# Patient Record
Sex: Female | Born: 1995 | Race: Black or African American | Hispanic: No | Marital: Single | Smoking: Never smoker
Health system: Southern US, Community
[De-identification: ages and names within clinical notes are randomized; demographics above are authoritative.]

---

## 2014-01-05 ENCOUNTER — Emergency Department (HOSPITAL_COMMUNITY)
Admission: EM | Admit: 2014-01-05 | Discharge: 2014-01-05 | Disposition: A | Discharge status: Home / Self Care | Payer: Self-pay | Attending: Emergency Medicine | Admitting: Emergency Medicine

## 2014-01-05 ENCOUNTER — Encounter (HOSPITAL_COMMUNITY): Payer: Self-pay | Admitting: Emergency Medicine

## 2014-01-05 DIAGNOSIS — J029 Acute pharyngitis, unspecified: Secondary | ICD-10-CM | POA: Insufficient documentation

## 2014-01-05 LAB — RAPID STREP SCREEN (MED CTR MEBANE ONLY): Streptococcus, Group A Screen (Direct): NEGATIVE

## 2014-01-05 MED ORDER — IBUPROFEN 600 MG PO TABS: 600.0000 mg | ORAL_TABLET | Freq: Three times a day (TID) | ORAL | Status: DC

## 2014-01-05 NOTE — ED Provider Notes (Signed)
CSN: [id number]     Arrival date & time 01/05/14  [id number] History   First MD Initiated Contact with Patient 01/05/14 0601     Chief Complaint  Patient presents with  . Sore Throat     (Consider location/radiation/quality/duration/timing/severity/associated sxs/prior Treatment) Patient is a 18 y.o. female presenting with pharyngitis.  Sore Throat Associated symptoms include a sore throat. Pertinent negatives include no abdominal pain, chest pain, chills, congestion, coughing, diaphoresis, fever or headaches.    Ms Kiara Powell is a 18 year old woman with no PMH presenting with 2 days of sore throat. The sore throat came on suddenly two days ago and has been persistent since. She thinks she had a fever of 100-101 that day but since has been afebrile. She also thinks she had a 10 minute dry coughing spell that morning but no cough since. She went to her school's clinic on Sunday and was told to take took ibuprofen and mucinex for the throat but did not get swabbed. Nothing makes it better or worse.   History reviewed. No pertinent past medical history. History reviewed. No pertinent past surgical history. Family History  Problem Relation Age of Onset  . CAD Mother    History  Substance Use Topics  . Smoking status: Never Smoker   . Smokeless tobacco: Not on file  . Alcohol Use: Yes     Comment: rare   OB History   Grav Para Term Preterm Abortions TAB SAB Ect Mult Living                 Review of Systems  Constitutional: Negative for fever, chills and diaphoresis.  HENT: Positive for sore throat and trouble swallowing. Negative for congestion, ear discharge, ear pain and rhinorrhea.   Eyes: Negative for pain and discharge.  Respiratory: Negative for cough and shortness of breath.   Cardiovascular: Negative for chest pain and palpitations.  Gastrointestinal: Negative for abdominal pain.  Neurological: Negative for headaches.  Hematological: Negative for adenopathy.      Allergies   Review of patient's allergies indicates no known allergies.  Home Medications   Prior to Admission medications   Medication Sig Start Date End Date Taking? Authorizing Provider  ibuprofen (ADVIL,MOTRIN) 200 MG tablet Take 600 mg by mouth every 6 (six) hours as needed for moderate pain.   Yes Historical Provider, MD  Phenylephrine-APAP-Guaifenesin [facility](MUCINEX SINUS-MAX) 10-650-400 MG/20ML LIQD Take 20 mLs by mouth every 8 (eight) hours as needed. Congestion   Yes Historical Provider, MD   BP 128/70  Pulse 97  Temp(Src) 98.4 F (36.9 C) (Oral)  Resp 18  Ht 5\' 6"  (1.676 m)  Wt 119 lb (53.978 kg)  BMI 19.22 kg/m2  SpO2 100%  LMP 12/25/2013 Physical Exam  Vitals reviewed. Constitutional: She is oriented to person, place, and time. She appears well-developed and well-nourished. No distress.  HENT:  Head: Normocephalic and atraumatic.  Right Ear: External ear normal.  Left Ear: External ear normal.  Mouth/Throat: Oropharynx is clear and moist. No oropharyngeal exudate.  Eyes: Conjunctivae and EOM are normal. Pupils are equal, round, and reactive to light. Right eye exhibits no discharge. Left eye exhibits no discharge. No scleral icterus.  Neck: Normal range of motion.  Cardiovascular: Normal rate, regular rhythm, normal heart sounds and intact distal pulses.  Exam reveals no gallop and no friction rub.   No murmur heard. Pulmonary/Chest: Effort normal and breath sounds normal. No respiratory distress.  Abdominal: Soft. Bowel sounds are normal. She exhibits no distension. There  is no tenderness.  Lymphadenopathy:    She has no cervical adenopathy.  Neurological: She is alert and oriented to person, place, and time.  Skin: She is not diaphoretic.    ED Course  Procedures (including critical care time) Labs Review Labs Reviewed  RAPID STREP SCREEN  CULTURE, GROUP A STREP    Imaging Review No results found.   EKG Interpretation None      MDM   Final diagnoses:  None     7:24AM: Patient with no PMH has sore throat x 2 days. Rapid strep test negative. No fever (question of initial fever at home), no exudate, no cervical lymphadenopathy likely viral pharyngitis. Will encourage fluid hydration and provide anti-inflammatory.    Lorenda Hatchet, MD 01/05/14 0730

## 2014-01-05 NOTE — Discharge Instructions (Signed)
You were seen in the ED today for your sore throat which is likely due to a viral infection. Please drink plenty of fluids and take the ibuprofen as prescribed. Please seek medical attention or return to the ED if you have any new or worsening throat pain, difficulty swallowing, fevers, or other worrisome medical condition.

## 2014-01-05 NOTE — ED Notes (Signed)
Pt is c/o a sore throat since Sunday  Pt states it hurts to swallow and even to breathe  Pt has been taking OTC medication without relief

## 2014-01-05 NOTE — ED Provider Notes (Signed)
I saw and evaluated the patient, reviewed the resident's note and I agree with the findings and plan.   EKG Interpretation None      Likely viral. No PTA. Well appearing  Lyanne CoKevin M Quy Lotts, MD 01/05/14 [phone]

## 2014-01-07 LAB — CULTURE, GROUP A STREP

## 2015-05-02 ENCOUNTER — Emergency Department (HOSPITAL_COMMUNITY)
Admission: EM | Admit: 2015-05-02 | Discharge: 2015-05-02 | Disposition: A | Discharge status: Home / Self Care | Payer: BLUE CROSS/BLUE SHIELD | Attending: Emergency Medicine | Admitting: Emergency Medicine

## 2015-05-02 ENCOUNTER — Encounter (HOSPITAL_COMMUNITY): Payer: Self-pay | Admitting: Emergency Medicine

## 2015-05-02 DIAGNOSIS — H9209 Otalgia, unspecified ear: Secondary | ICD-10-CM | POA: Diagnosis not present

## 2015-05-02 DIAGNOSIS — R05 Cough: Secondary | ICD-10-CM | POA: Diagnosis present

## 2015-05-02 DIAGNOSIS — J069 Acute upper respiratory infection, unspecified: Secondary | ICD-10-CM | POA: Diagnosis not present

## 2015-05-02 NOTE — ED Provider Notes (Signed)
CSN: [id number]     Arrival date & time 05/02/15  1913 History  By signing my name below, I, Budd Palmer, attest that this documentation has been prepared under the direction and in the presence of [employer], PA-C. Electronically Signed: Budd Palmer, ED Scribe. 05/02/2015. 8:06 PM.    Chief Complaint  Patient presents with  . URI   The history is provided by the patient. No language interpreter was used.   HPI Comments: Kiara Powell is a 19 y.o. female who presents to the Emergency Department complaining of a worsening URI onset 5 days ago, worsening significantly as of yesterday. She reports associated congestion, HA, non-productive cough, rhinorrhea, ear pain, sore throat, and post-nasal drip. She notes that she had some bloody mucus after blowing her nose today, but reports she has been blowing her nose quite often since onset. She states she has tried taking Dayquil and Nyquil with no relief. She states she did not receive a flu shot this year. Pt denies fever and n/v.   History reviewed. No pertinent past medical history. History reviewed. No pertinent past surgical history. Family History  Problem Relation Age of Onset  . CAD Mother    Social History  Substance Use Topics  . Smoking status: Never Smoker   . Smokeless tobacco: None  . Alcohol Use: No   OB History    No data available     Review of Systems  Constitutional: Negative for fever.  HENT: Positive for congestion, ear pain, postnasal drip, rhinorrhea, sinus pressure and sore throat.   Respiratory: Positive for cough.   Gastrointestinal: Negative for nausea and vomiting.  Neurological: Positive for headaches.  All other systems reviewed and are negative.   Allergies  Review of patient's allergies indicates no known allergies.  Home Medications   Prior to Admission medications   Medication Sig Start Date End Date Taking? Authorizing Provider  ibuprofen (ADVIL,MOTRIN) 200 MG tablet Take 600 mg by  mouth every 6 (six) hours as needed for moderate pain.    Historical Provider, MD  ibuprofen (ADVIL,MOTRIN) 600 MG tablet Take 1 tablet (600 mg total) by mouth 3 (three) times daily. For five days. Take with food. 01/05/14   Lorenda Hatchet, MD  Phenylephrine-APAP-Guaifenesin [facility]) [phone] MG/20ML LIQD Take 20 mLs by mouth every 8 (eight) hours as needed. Congestion    Historical Provider, MD   BP 113/66 mmHg  Pulse 85  Temp(Src) 98.2 F (36.8 C) (Oral)  Resp 18  Wt 115 lb (52.164 kg)  SpO2 100%  LMP 04/23/2015 (Exact Date) Physical Exam  Constitutional: She is oriented to person, place, and time. She appears well-developed and well-nourished. No distress.  HENT:  Head: Normocephalic and atraumatic.  Right Ear: Tympanic membrane and external ear normal.  Left Ear: Tympanic membrane and external ear normal.  Nose: Nose normal.  Mouth/Throat: Oropharynx is clear and moist. No oropharyngeal exudate.  Eyes: Conjunctivae and EOM are normal. Pupils are equal, round, and reactive to light. Right eye exhibits no discharge. Left eye exhibits no discharge. No scleral icterus.  Neck: Neck supple.  Cardiovascular: Normal rate, regular rhythm, normal heart sounds and intact distal pulses.  Exam reveals no gallop and no friction rub.   No murmur heard. Pulmonary/Chest: Effort normal and breath sounds normal. No respiratory distress. She has no wheezes. She has no rales. She exhibits no tenderness.  Abdominal: Soft. She exhibits no distension. There is no tenderness. There is no guarding.  Musculoskeletal: Normal range of motion.  She exhibits no edema.  Lymphadenopathy:    She has no cervical adenopathy.  Neurological: She is alert and oriented to person, place, and time. Coordination normal.  Skin: Skin is warm and dry. No rash noted. She is not diaphoretic. No erythema. No pallor.  Psychiatric: She has a normal mood and affect. Her behavior is normal.  Nursing note and vitals  reviewed.   ED Course  Procedures  DIAGNOSTIC STUDIES: Oxygen Saturation is 100% on RA, normal by my interpretation.    COORDINATION OF CARE: 8:06 PM - Discussed probable viral infection and sinus congestion. Discussed plans to discharge. Advised to take sudafed for decongestant and to stay hydrated. Pt advised of plan for treatment and pt agrees.  Labs Review Labs Reviewed - No data to display  Imaging Review No results found. I have personally reviewed and evaluated these images and lab results as part of my medical decision-making.   EKG Interpretation None      MDM   Final diagnoses:  URI (upper respiratory infection)   Patients symptoms are consistent with URI, likely viral etiology. Discussed that antibiotics are not indicated for viral infections. Lungs CTAB. No SOB. No cxr indicated at this time. Pt will be discharged with symptomatic treatment.  Verbalizes understanding and is agreeable with plan. Pt is hemodynamically stable & in NAD prior to dc.   I personally performed the services described in this documentation, which was scribed in my presence. The recorded information has been reviewed and is accurate.     Lester KinsmanSamantha Tripp RoxanaDowless, PA-C 05/03/15 2322  Lorre NickAnthony Allen, MD 05/05/15 [phone]

## 2015-05-02 NOTE — Discharge Instructions (Signed)
Upper Respiratory Infection, Adult Most upper respiratory infections (URIs) are a viral infection of the air passages leading to the lungs. A URI affects the nose, throat, and upper air passages. The most common type of URI is nasopharyngitis and is typically referred to as "the common cold." URIs run their course and usually go away on their own. Most of the time, a URI does not require medical attention, but sometimes a bacterial infection in the upper airways can follow a viral infection. This is called a secondary infection. Sinus and middle ear infections are common types of secondary upper respiratory infections. Bacterial pneumonia can also complicate a URI. A URI can worsen asthma and chronic obstructive pulmonary disease (COPD). Sometimes, these complications can require emergency medical care and may be life threatening.  CAUSES Almost all URIs are caused by viruses. A virus is a type of germ and can spread from one person to another.  RISKS FACTORS You may be at risk for a URI if:   You smoke.   You have chronic heart or lung disease.  You have a weakened defense (immune) system.   You are very young or very old.   You have nasal allergies or asthma.  You work in crowded or poorly ventilated areas.  You work in health care facilities or schools. SIGNS AND SYMPTOMS  Symptoms typically develop 2-3 days after you come in contact with a cold virus. Most viral URIs last 7-10 days. However, viral URIs from the influenza virus (flu virus) can last 14-18 days and are typically more severe. Symptoms may include:   Runny or stuffy (congested) nose.   Sneezing.   Cough.   Sore throat.   Headache.   Fatigue.   Fever.   Loss of appetite.   Pain in your forehead, behind your eyes, and over your cheekbones (sinus pain).  Muscle aches.  DIAGNOSIS  Your health care provider may diagnose a URI by:  Physical exam.  Tests to check that your symptoms are not due to  another condition such as:  Strep throat.  Sinusitis.  Pneumonia.  Asthma. TREATMENT  A URI goes away on its own with time. It cannot be cured with medicines, but medicines may be prescribed or recommended to relieve symptoms. Medicines may help:  Reduce your fever.  Reduce your cough.  Relieve nasal congestion. HOME CARE INSTRUCTIONS   Take medicines only as directed by your health care provider.   Gargle warm saltwater or take cough drops to comfort your throat as directed by your health care provider.  Use a warm mist humidifier or inhale steam from a shower to increase air moisture. This may make it easier to breathe.  Drink enough fluid to keep your urine clear or pale yellow.   Eat soups and other clear broths and maintain good nutrition.   Rest as needed.   Return to work when your temperature has returned to normal or as your health care provider advises. You may need to stay home longer to avoid infecting others. You can also use a face mask and careful hand washing to prevent spread of the virus.  Increase the usage of your inhaler if you have asthma.   Do not use any tobacco products, including cigarettes, chewing tobacco, or electronic cigarettes. If you need help quitting, ask your health care provider. PREVENTION  The best way to protect yourself from getting a cold is to practice good hygiene.   Avoid oral or hand contact with people with cold  symptoms.   Wash your hands often if contact occurs.  There is no clear evidence that vitamin C, vitamin E, echinacea, or exercise reduces the chance of developing a cold. However, it is always recommended to get plenty of rest, exercise, and practice good nutrition.  SEEK MEDICAL CARE IF:   You are getting worse rather than better.   Your symptoms are not controlled by medicine.   You have chills.  You have worsening shortness of breath.  You have brown or red mucus.  You have yellow or brown nasal  discharge.  You have pain in your face, especially when you bend forward.  You have a fever.  You have swollen neck glands.  You have pain while swallowing.  You have white areas in the back of your throat. SEEK IMMEDIATE MEDICAL CARE IF:   You have severe or persistent:  Headache.  Ear pain.  Sinus pain.  Chest pain.  You have chronic lung disease and any of the following:  Wheezing.  Prolonged cough.  Coughing up blood.  A change in your usual mucus.  You have a stiff neck.  You have changes in your:  Vision.  Hearing.  Thinking.  Mood. MAKE SURE YOU:   Understand these instructions.  Will watch your condition.  Will get help right away if you are not doing well or get worse.   This information is not intended to replace advice given to you by your health care provider. Make sure you discuss any questions you have with your health care provider.  Follow-up with her primary care provider if symptoms aren't improved. Take OTC Sudafed as needed for decongestion., Return to the emergency department if you experience fever, difficulty breathing, difficulty swallowing, vomiting.

## 2015-05-02 NOTE — ED Notes (Signed)
Pt states she has been having cold symptoms x 1 week  Pt states she has been taking OTC medication but it is not helping  Pt states her sxs got worse yesterday

## 2015-06-21 ENCOUNTER — Emergency Department (HOSPITAL_COMMUNITY): Payer: BLUE CROSS/BLUE SHIELD

## 2015-06-21 ENCOUNTER — Emergency Department (HOSPITAL_COMMUNITY)
Admission: EM | Admit: 2015-06-21 | Discharge: 2015-06-22 | Disposition: A | Discharge status: Home / Self Care | Payer: BLUE CROSS/BLUE SHIELD | Attending: Emergency Medicine | Admitting: Emergency Medicine

## 2015-06-21 ENCOUNTER — Encounter (HOSPITAL_COMMUNITY): Payer: Self-pay | Admitting: Emergency Medicine

## 2015-06-21 DIAGNOSIS — R0789 Other chest pain: Secondary | ICD-10-CM

## 2015-06-21 DIAGNOSIS — J45909 Unspecified asthma, uncomplicated: Secondary | ICD-10-CM | POA: Diagnosis not present

## 2015-06-21 DIAGNOSIS — R079 Chest pain, unspecified: Secondary | ICD-10-CM | POA: Diagnosis present

## 2015-06-21 LAB — I-STAT TROPONIN, ED: Troponin i, poc: 0.01 ng/mL (ref 0.00–0.08)

## 2015-06-21 LAB — BASIC METABOLIC PANEL
Anion gap: 10 (ref 5–15)
BUN: 17 mg/dL (ref 6–20)
CHLORIDE: 108 mmol/L (ref 101–111)
CO2: 23 mmol/L (ref 22–32)
Calcium: 9.2 mg/dL (ref 8.9–10.3)
Creatinine, Ser: 1.01 mg/dL — ABNORMAL HIGH (ref 0.44–1.00)
GFR calc Af Amer: >60 mL/min (ref >60)
GFR calc non Af Amer: >60 mL/min (ref >60)
Glucose, Bld: 81 mg/dL (ref 65–99)
Potassium: 4.2 mmol/L (ref 3.5–5.1)
Sodium: 141 mmol/L (ref 135–145)

## 2015-06-21 LAB — CBC
HCT: 33.7 % — ABNORMAL LOW (ref 36.0–46.0)
Hemoglobin: 10.9 g/dL — ABNORMAL LOW (ref 12.0–15.0)
MCH: 26.6 pg (ref 26.0–34.0)
MCHC: 32.3 g/dL (ref 30.0–36.0)
MCV: 82.2 fL (ref 78.0–100.0)
PLATELETS: 248 10*3/uL (ref 150–400)
RBC: 4.1 MIL/uL (ref 3.87–5.11)
RDW: 13.7 % (ref 11.5–15.5)
WBC: 10.1 10*3/uL (ref 4.0–10.5)

## 2015-06-21 NOTE — ED Notes (Signed)
Patient presents for centralized chest pain, non radiating starting earlier this afternoon. States she was running and became SOB and then had a sudden onset of chest soreness. Denies cough, N/V, diaphoresis.

## 2015-06-21 NOTE — ED Notes (Signed)
Delay in lab draw, pt in exray 

## 2015-06-21 NOTE — ED Provider Notes (Signed)
CSN: [id number]     Arrival date & time 06/21/15  2209 History   First MD Initiated Contact with Patient 06/21/15 2231     Chief Complaint  Patient presents with  . Chest Pain     HPI   Kiara Powell is an 20 y.o. female with history of exercise-induced asthma who presents to the ED for evaluation of chest pain. She states she was in her usual state of health until 8AM this morning when she was playing basketball in her school gym and started feeling short of breath. She states it felt like an asthma attack, which she has not had in years. She states that as she caught her breath she began to experience substernal sharp chest pain. She states the pain waxed and waned all day. States it eventually resolved over the course of the afternoon. However, she states she continues to feel pressure in her chest. She states the sensation does not radiate. She states it feels like it is worse when she swallows. Denies feeling faint, dizzy, or lightheaded. Denies diaphoresis. Denies syncope. Denies anything like this ever happening before. Denies fam hx of sudden cardiac death. She has not tried anything to help with her symptoms.   History reviewed. No pertinent past medical history. History reviewed. No pertinent past surgical history. Family History  Problem Relation Age of Onset  . CAD Mother    Social History  Substance Use Topics  . Smoking status: Never Smoker   . Smokeless tobacco: None  . Alcohol Use: No   OB History    No data available     Review of Systems  All other systems reviewed and are negative.     Allergies  Review of patient's allergies indicates no known allergies.  Home Medications   Prior to Admission medications   Not on File   BP 117/71 mmHg  Pulse 67  Temp(Src) 98 F (36.7 C) (Oral)  Resp 18  SpO2 99%  LMP 06/21/2015 Physical Exam  Constitutional: She is oriented to person, place, and time. No distress.  HENT:  Right Ear: External ear normal.  Left Ear:  External ear normal.  Nose: Nose normal.  Mouth/Throat: Oropharynx is clear and moist. No oropharyngeal exudate.  Eyes: Conjunctivae and EOM are normal. Pupils are equal, round, and reactive to light.  Neck: Normal range of motion. Neck supple.  Cardiovascular: Normal rate, regular rhythm, normal heart sounds and intact distal pulses.   Pulmonary/Chest: Effort normal and breath sounds normal. No respiratory distress. She has no wheezes. She has no rales.    Chest pain/pressure reproducible with mid-sternal palpation  Abdominal: Soft. Bowel sounds are normal. She exhibits no distension. There is no tenderness.  Musculoskeletal: She exhibits no edema.  Neurological: She is alert and oriented to person, place, and time. No cranial nerve deficit.  Skin: Skin is warm and dry. She is not diaphoretic.  Psychiatric: She has a normal mood and affect.  Nursing note and vitals reviewed.   ED Course  Procedures (including critical care time) Labs Review Labs Reviewed  BASIC METABOLIC PANEL - Abnormal; Notable for the following:    Creatinine, Ser 1.01 (*)    All other components within normal limits  CBC - Abnormal; Notable for the following:    Hemoglobin 10.9 (*)    HCT 33.7 (*)    All other components within normal limits  I-STAT TROPOININ, ED  Rosezena Sensor, ED    Imaging Review Dg Chest 2 View  06/21/2015  CLINICAL DATA:  Centralized non radiating chest pain beginning this afternoon while running. Shortness of breath. EXAM: CHEST  2 VIEW COMPARISON:  None. FINDINGS: Cardiomediastinal silhouette is normal. The lungs are clear without pleural effusions or focal consolidations. Trachea projects midline and there is no pneumothorax. Soft tissue planes and included osseous structures are non-suspicious. IMPRESSION: Normal chest. Electronically Signed   By: Awilda Metro M.D.   On: 06/21/2015 23:20   I have personally reviewed and evaluated these images and lab results as part of my  medical decision-making.   EKG Interpretation   Date/Time:  Tuesday June 21 2015 22:21:27 EST Ventricular Rate:  73 PR Interval:  151 QRS Duration: 85 QT Interval:  372 QTC Calculation: 410 R Axis:   84 Text Interpretation:  Sinus rhythm RSR' in V1 or V2, probably normal  variant No previous ECGs available Confirmed by Manus Gunning  MD, STEPHEN  [phone]) on 06/21/2015 10:25:16 PM      MDM   Final diagnoses:  Chest wall pain    Pt is an 20 y.o. female with history of asthma with chest pain that is now chest pressure that started after what sounds like an episode of exercise-induced asthma, which she has a known history of. Initial troponin and EKG negative. CXR unremarkable. Pressure is reproducible with palpation. I suspect musculoskeletal chest wall pain. Delta trop is pending which I anticipate will be negative. Will have to sign out to oncoming PA-C Elpidio Anis. Anticipate discharge home with PCP f/u pending negative delta troponin.    Carlene Coria, PA-C 06/22/15 9629  Melene Plan, DO 06/22/15 2303

## 2015-06-22 LAB — I-STAT TROPONIN, ED: TROPONIN I, POC: 0 ng/mL (ref 0.00–0.08)

## 2015-06-22 MED ORDER — IBUPROFEN 600 MG PO TABS: 600.0000 mg | ORAL_TABLET | Freq: Four times a day (QID) | ORAL | Status: DC | PRN

## 2015-06-22 NOTE — Discharge Instructions (Signed)
Chest Wall Pain °Chest wall pain is pain in or around the bones and muscles of your chest. Sometimes, an injury causes this pain. Sometimes, the cause may not be known. This pain may take several weeks or longer to get better. °HOME CARE INSTRUCTIONS  °Pay attention to any changes in your symptoms. Take these actions to help with your pain:  °· Rest as told by your health care provider.   °· Avoid activities that cause pain. These include any activities that use your chest muscles or your abdominal and side muscles to lift heavy items.    °· If directed, apply ice to the painful area: °· Put ice in a plastic bag. °· Place a towel between your skin and the bag. °· Leave the ice on for 20 minutes, 2-3 times per day. °· Take over-the-counter and prescription medicines only as told by your health care provider. °· Do not use tobacco products, including cigarettes, chewing tobacco, and e-cigarettes. If you need help quitting, ask your health care provider. °· Keep all follow-up visits as told by your health care provider. This is important. °SEEK MEDICAL CARE IF: °· You have a fever. °· Your chest pain becomes worse. °· You have new symptoms. °SEEK IMMEDIATE MEDICAL CARE IF: °· You have nausea or vomiting. °· You feel sweaty or light-headed. °· You have a cough with phlegm (sputum) or you cough up blood. °· You develop shortness of breath. °  °This information is not intended to replace advice given to you by your health care provider. Make sure you discuss any questions you have with your health care provider. °  °Document Released: 05/07/2005 Document Revised: 01/26/2015 Document Reviewed: 08/02/2014 °Elsevier Interactive Patient Education ©2016 Elsevier Inc[employer] °Heat Therapy °Heat therapy can help ease sore, stiff, injured, and tight muscles and joints. Heat relaxes your muscles, which may help ease your pain.  °RISKS AND COMPLICATIONS °If you have any of the following conditions, do not use heat therapy unless your  health care provider has approved: °· Poor circulation. °· Healing wounds or scarred skin in the area being treated. °· Diabetes, heart disease, or high blood pressure. °· Not being able to feel (numbness) the area being treated. °· Unusual swelling of the area being treated. °· Active infections. °· Blood clots. °· Cancer. °· Inability to communicate pain. This may include young children and people who have problems with their brain function (dementia). °· Pregnancy. °Heat therapy should only be used on old, pre-existing, or long-lasting (chronic) injuries. Do not use heat therapy on new injuries unless directed by your health care provider. °HOW TO USE HEAT THERAPY °There are several different kinds of heat therapy, including: °· Moist heat pack. °· Warm water bath. °· Hot water bottle. °· Electric heating pad. °· Heated gel pack. °· Heated wrap. °· Electric heating pad. °Use the heat therapy method suggested by your health care provider. Follow your health care provider's instructions on when and how to use heat therapy. °GENERAL HEAT THERAPY RECOMMENDATIONS °· Do not sleep while using heat therapy. Only use heat therapy while you are awake. °· Your skin may turn pink while using heat therapy. Do not use heat therapy if your skin turns red. °· Do not use heat therapy if you have new pain. °· High heat or long exposure to heat can cause burns. Be careful when using heat therapy to avoid burning your skin. °· Do not use heat therapy on areas of your skin that are already irritated, such as with a   rash or sunburn. °SEEK MEDICAL CARE IF: °· You have blisters, redness, swelling, or numbness. °· You have new pain. °· Your pain is worse. °MAKE SURE YOU: °· Understand these instructions. °· Will watch your condition. °· Will get help right away if you are not doing well or get worse. °  °This information is not intended to replace advice given to you by your health care provider. Make sure you discuss any questions you  have with your health care provider. °  °Document Released: 07/30/2011 Document Revised: 05/28/2014 Document Reviewed: 06/30/2013 °Elsevier Interactive Patient Education ©2016 Elsevier Inc. ° °

## 2015-06-22 NOTE — ED Notes (Signed)
Discharge instructions, follow up care, and rx x1 reviewed with patient. Patient verbalized understanding. 

## 2015-06-22 NOTE — ED Notes (Signed)
PA at bedside.

## 2015-06-22 NOTE — ED Provider Notes (Signed)
SOB, chest tightness with persistent chest pressure (was pain) while playing basketball this morning. No fevers, no cough. Chest wall tenderness on exam.  Delta troponin pending.   Delta trop negative. Will treat as chest wall pain c/w evaluation and exam. VSS.   Elpidio Anis, PA-C 06/22/15 [id number]  Azalia Bilis, MD 06/22/15 [phone]

## 2015-07-09 ENCOUNTER — Encounter (HOSPITAL_COMMUNITY): Payer: Self-pay | Admitting: Emergency Medicine

## 2015-07-09 ENCOUNTER — Emergency Department (HOSPITAL_COMMUNITY)
Admission: EM | Admit: 2015-07-09 | Discharge: 2015-07-09 | Disposition: A | Discharge status: Home / Self Care | Payer: BLUE CROSS/BLUE SHIELD | Attending: Emergency Medicine | Admitting: Emergency Medicine

## 2015-07-09 DIAGNOSIS — F10120 Alcohol abuse with intoxication, uncomplicated: Secondary | ICD-10-CM | POA: Diagnosis not present

## 2015-07-09 DIAGNOSIS — F10929 Alcohol use, unspecified with intoxication, unspecified: Secondary | ICD-10-CM | POA: Insufficient documentation

## 2015-07-09 DIAGNOSIS — F419 Anxiety disorder, unspecified: Secondary | ICD-10-CM | POA: Insufficient documentation

## 2015-07-09 DIAGNOSIS — R064 Hyperventilation: Secondary | ICD-10-CM | POA: Insufficient documentation

## 2015-07-09 DIAGNOSIS — F10920 Alcohol use, unspecified with intoxication, uncomplicated: Secondary | ICD-10-CM

## 2015-07-09 DIAGNOSIS — Z3202 Encounter for pregnancy test, result negative: Secondary | ICD-10-CM | POA: Diagnosis not present

## 2015-07-09 DIAGNOSIS — F10129 Alcohol abuse with intoxication, unspecified: Secondary | ICD-10-CM | POA: Diagnosis present

## 2015-07-09 LAB — I-STAT CHEM 8, ED
BUN: 9 mg/dL (ref 6–20)
CALCIUM ION: 1.12 mmol/L (ref 1.12–1.23)
Chloride: 112 mmol/L — ABNORMAL HIGH (ref 101–111)
Creatinine, Ser: 0.8 mg/dL (ref 0.44–1.00)
GLUCOSE: 133 mg/dL — AB (ref 65–99)
HCT: 40 % (ref 36.0–46.0)
HEMOGLOBIN: 13.6 g/dL (ref 12.0–15.0)
Potassium: 3.5 mmol/L (ref 3.5–5.1)
Sodium: 145 mmol/L (ref 135–145)
TCO2: 15 mmol/L (ref 0–100)

## 2015-07-09 LAB — CBC WITH DIFFERENTIAL/PLATELET
BASOS ABS: 0 10*3/uL (ref 0.0–0.1)
BASOS PCT: 0 %
Eosinophils Absolute: 0.1 10*3/uL (ref 0.0–0.7)
Eosinophils Relative: 1 %
HCT: 36.3 % (ref 36.0–46.0)
Hemoglobin: 12.1 g/dL (ref 12.0–15.0)
LYMPHS ABS: 2.9 10*3/uL (ref 0.7–4.0)
Lymphocytes Relative: 33 %
MCH: 27.3 pg (ref 26.0–34.0)
MCHC: 33.3 g/dL (ref 30.0–36.0)
MCV: 81.9 fL (ref 78.0–100.0)
MONOS PCT: 7 %
Monocytes Absolute: 0.6 10*3/uL (ref 0.1–1.0)
NEUTROS ABS: 5.4 10*3/uL (ref 1.7–7.7)
NEUTROS PCT: 59 %
PLATELETS: 250 10*3/uL (ref 150–400)
RBC: 4.43 MIL/uL (ref 3.87–5.11)
RDW: 13.4 % (ref 11.5–15.5)
WBC: 8.9 10*3/uL (ref 4.0–10.5)

## 2015-07-09 LAB — COMPREHENSIVE METABOLIC PANEL
ALT: 16 U/L (ref 14–54)
ANION GAP: 11 (ref 5–15)
AST: 22 U/L (ref 15–41)
Albumin: 4.9 g/dL (ref 3.5–5.0)
Alkaline Phosphatase: 38 U/L (ref 38–126)
BUN: 10 mg/dL (ref 6–20)
CALCIUM: 9.3 mg/dL (ref 8.9–10.3)
CHLORIDE: 113 mmol/L — AB (ref 101–111)
CO2: 16 mmol/L — ABNORMAL LOW (ref 22–32)
Creatinine, Ser: 0.7 mg/dL (ref 0.44–1.00)
GFR calc non Af Amer: >60 mL/min (ref >60)
Glucose, Bld: 141 mg/dL — ABNORMAL HIGH (ref 65–99)
Potassium: 3.3 mmol/L — ABNORMAL LOW (ref 3.5–5.1)
Sodium: 140 mmol/L (ref 135–145)
Total Bilirubin: 0.5 mg/dL (ref 0.3–1.2)
Total Protein: 8.1 g/dL (ref 6.5–8.1)

## 2015-07-09 LAB — URINALYSIS, ROUTINE W REFLEX MICROSCOPIC
Bilirubin Urine: NEGATIVE
Glucose, UA: NEGATIVE mg/dL
Hgb urine dipstick: NEGATIVE
Ketones, ur: NEGATIVE mg/dL
LEUKOCYTES UA: NEGATIVE
Nitrite: NEGATIVE
PH: 6 (ref 5.0–8.0)
PROTEIN: NEGATIVE mg/dL
Specific Gravity, Urine: 1.006 (ref 1.005–1.030)

## 2015-07-09 LAB — ACETAMINOPHEN LEVEL

## 2015-07-09 LAB — I-STAT BETA HCG BLOOD, ED (MC, WL, AP ONLY)

## 2015-07-09 LAB — RAPID URINE DRUG SCREEN, HOSP PERFORMED
Amphetamines: NOT DETECTED
Barbiturates: NOT DETECTED
Benzodiazepines: NOT DETECTED
COCAINE: NOT DETECTED
Opiates: NOT DETECTED
Tetrahydrocannabinol: NOT DETECTED

## 2015-07-09 LAB — ETHANOL: Alcohol, Ethyl (B): 114 mg/dL — ABNORMAL HIGH (ref <5)

## 2015-07-09 LAB — SALICYLATE LEVEL: Salicylate Lvl: <4 mg/dL (ref 2.8–30.0)

## 2015-07-09 MED ORDER — SODIUM CHLORIDE 0.9 % IV BOLUS (SEPSIS)
1000.0000 mL | Freq: Once | INTRAVENOUS | Status: AC
  Administered 2015-07-09: 1000 mL via INTRAVENOUS

## 2015-07-09 MED ORDER — SODIUM CHLORIDE 0.9 % IV SOLN
INTRAVENOUS | Status: DC
Start: 2015-07-09 — End: 2015-07-09
  Administered 2015-07-09: 125 mL/h via INTRAVENOUS
  Administered 2015-07-09: 13:00:00 via INTRAVENOUS
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
## 2015-07-09 NOTE — ED Notes (Signed)
Patient given ginger ale. 
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
## 2015-07-09 NOTE — BH Assessment (Signed)
Per Dr. Jannifer Franklin and Julieanne Cotton, NP patient to discharge home. Patient will follow up with # on insurance card to locate a therapist (patient moving to [place]).

## 2015-07-09 NOTE — [facility] Suicide Risk Assessment (Cosign Needed)
Suicide Risk Assessment  Discharge Assessment   [facility] Discharge Suicide Risk Assessment   Principal Problem: Alcoholic intoxication without complication [facility]) Discharge Diagnoses:  Patient Active Problem List   Diagnosis Date Noted  . Alcoholic intoxication without complication (HCC) [F10.120] 07/09/2015    Priority: High  . Alcohol intoxication (HCC) [F10.129]   . Anxiety [F41.9]     Total Time spent with patient: 20 minutes  Musculoskeletal: Strength & Muscle Tone: within normal limits Gait & Station: normal Patient leans: N/A  Psychiatric Specialty Exam:   Blood pressure 127/86, pulse 71, temperature 98.6 F (37 C), temperature source Oral, resp. rate 16, last menstrual period 06/21/2015, SpO2 100 %.There is no weight on file to calculate BMI.  General Appearance: Casual and Fairly Groomed  Patent attorney:: Good  Speech: Clear and Coherent and Normal Rate  Volume: Normal  Mood: Angry and Anxious  Affect: Congruent and Tearful  Thought Process: Coherent, Goal Directed and Intact  Orientation: Full (Time, Place, and Person)  Thought Content: WDL  Suicidal Thoughts: No  Homicidal Thoughts: No  Memory: Immediate; Good Recent; Good Remote; Good  Judgement: Good  Insight: Good  Psychomotor Activity: Normal  Concentration: Good  Recall: NA  Fund of Knowledge:Good  Language: Good  Akathisia: NA  Handed: Right  AIMS (if indicated):    Assets: Desire for Improvement  ADL's: Intact  Cognition: WNL         Mental Status Per Nursing Assessment::   On Admission:     Demographic Factors:  Adolescent or young adult  Loss Factors: NA  Historical Factors: NA  Risk Reduction Factors:   Living with another person, especially a relative  Continued Clinical Symptoms:  Alcohol/Substance Abuse/Dependencies  Cognitive Features That Contribute To Risk:  None    Suicide Risk:  Minimal: No identifiable suicidal  ideation.  Patients presenting with no risk factors but with morbid ruminations; may be classified as minimal risk based on the severity of the depressive symptoms    Plan Of Care/Follow-up recommendations:  Activity:  as tolerated Diet:  Regular  Earney Navy, NP   PMHNP-BC 07/09/2015, 1:14 PM

## 2015-07-09 NOTE — BH Assessment (Signed)
Assessment completed. Consulted Hulan Fess, NP who recommended that pt be evaluated in the morning. Informed Danelle Berry, PA-C of the recommendation.

## 2015-07-09 NOTE — ED Notes (Signed)
Pt sleeping in room.  No signs of distress.  Will continue to monitor.

## 2015-07-09 NOTE — ED Notes (Signed)
Patient stated that she wanted to hurt herself while crying. Patient stated that she wanted to drive her car into traffic.

## 2015-07-09 NOTE — BH Assessment (Addendum)
Tele Assessment Note   Kiara Powell is an 20 y.o. female presenting to [facility] intoxicated and reporting increasing anxiety. PT reported that she is present in the ED due to having an anxiety attack. Pt stated "this is the first time I have had one related to stress; I usually have them when I am playing sports". Pt denies SI at this time; however it has been documented by pt's nurse that pt stated she wanted to drive her car into traffic and was crying at the time. Pt did not report any previous suicide attempts or self-injurious behaviors. Pt denies HI and AVH at this time. Pt did not report any current mental health treatment or psychiatric admissions. Pt reported that she is dealing with relationship and school stressors. Pt is endorsing multiple depressive symptoms and shared that her sleep has been poor. Pt denied any illicit substance use but shared that she drinks alcohol on a monthly basis. Pt did not report any pending criminal charges or upcoming court dates. Pt denied having access to weapons or firearms. Pt did not report any physical, sexual or emotional abuse at this time.    Recommendation: Psychiatric evaluation.   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 No pertinent past medical history.  History reviewed. No pertinent past surgical history.  Family History:  Family History  Problem Relation Age of Onset  . CAD Mother     Social History:  reports that she has never smoked. She does not have any smokeless tobacco history on file. She reports that she does not drink alcohol or use illicit drugs.  Additional Social History:  Alcohol / Drug Use History of alcohol / drug use?: Yes Substance #1 Name of Substance 1: Alcohol  1 - Age of First Use: 16 1 - Amount (size/oz): "2-3 shot"  1 - Frequency: monthly  1 - Duration: ongoing  1 - Last Use / Amount: 07-09-15 BAL-114  CIWA: CIWA-Ar BP: 117/71  mmHg Pulse Rate: 84 COWS:    PATIENT STRENGTHS: (choose at least two) Average or above average intelligence Supportive family/friends  Allergies: No Known Allergies  Home Medications:  (Not in a hospital admission)  OB/GYN Status:  Patient's last menstrual period was 06/21/2015.  General Assessment Data Location of Assessment: [facility] ED TTS Assessment: In system Is this a Tele or Face-to-Face Assessment?: Face-to-Face Is this an Initial Assessment or a Re-assessment for this encounter?: Initial Assessment Marital status: Single Living Arrangements: Non-relatives/Friends Can pt return to current living arrangement?: Yes Admission Status: Voluntary Is patient capable of signing voluntary admission?: Yes Referral Source: Self/Family/Friend Insurance type: BCBS     Crisis Care Plan Living Arrangements: Non-relatives/Friends Name of Psychiatrist: No provider reported  Name of Therapist: No provider reported   Education Status Is patient currently in school?: Yes Current Grade: Jr.  Highest grade of school patient has completed: Sophomore Name of school: Education officer, community person: N/A  Risk to self with the past 6 months Suicidal Ideation: No Has patient been a risk to self within the past 6 months prior to admission? : No Suicidal Intent: No Has patient had any suicidal intent within the past 6 months prior to admission? : No Is patient at risk for suicide?: No Suicidal Plan?: No Has patient had any suicidal plan within the past 6 months prior to admission? : No Access to Means: No What has been your use of drugs/alcohol within the last 12 months?: Pt reported monthly alcohol  use.  Previous Attempts/Gestures: No How many times?: 0 Other Self Harm Risks: Pt denies  Triggers for Past Attempts: None known Intentional Self Injurious Behavior: None Family Suicide History: No Recent stressful life event(s): Other (Comment) (Relationship and school stressors reported.  ) Persecutory voices/beliefs?: No Depression: Yes Depression Symptoms: Insomnia, Tearfulness, Isolating, Loss of interest in usual pleasures, Feeling worthless/self pity, Feeling angry/irritable Substance abuse history and/or treatment for substance abuse?: Yes  Risk to Others within the past 6 months Homicidal Ideation: No Does patient have any lifetime risk of violence toward others beyond the six months prior to admission? : No Thoughts of Harm to Others: No Current Homicidal Intent: No Current Homicidal Plan: No Access to Homicidal Means: No Identified Victim: N/A History of harm to others?: No Assessment of Violence: None Noted Violent Behavior Description: No violent behaviors observed. Pt is calm and cooperative at this time.  Does patient have access to weapons?: No Criminal Charges Pending?: No Does patient have a court date: No Is patient on probation?: No  Psychosis Hallucinations: None noted Delusions: None noted  Mental Status Report Appearance/Hygiene: Unremarkable Eye Contact: Poor Motor Activity: Freedom of movement Speech: Logical/coherent, Soft Level of Consciousness: Quiet/awake Mood: Euthymic Affect: Appropriate to circumstance Anxiety Level: Minimal Thought Processes: Coherent, Relevant Judgement: Partial Orientation: Appropriate for developmental age Obsessive Compulsive Thoughts/Behaviors: None  Cognitive Functioning Concentration: Normal Memory: Remote Intact, Recent Intact IQ: Average Insight: Fair Impulse Control: Fair Appetite: Good Weight Loss: 0 Weight Gain: 0 Sleep: Decreased Total Hours of Sleep: 5 Vegetative Symptoms: Staying in bed  ADLScreening [facility] Assessment Services) Patient's cognitive ability adequate to safely complete daily activities?: Yes Patient able to express need for assistance with ADLs?: Yes Independently performs ADLs?: Yes (appropriate for developmental age)  Prior Inpatient Therapy Prior Inpatient Therapy:  No Prior Therapy Dates: N/A Prior Therapy Facilty/Provider(s): N/A Reason for Treatment: N/A  Prior Outpatient Therapy Prior Outpatient Therapy: No Prior Therapy Dates: N/A Prior Therapy Facilty/Provider(s): N/A Reason for Treatment: N/A Does patient have an ACCT team?: No Does patient have Intensive In-House Services?  : No Does patient have [facility] services? : No Does patient have P4CC services?: No  ADL Screening (condition at time of admission) Patient's cognitive ability adequate to safely complete daily activities?: Yes Is the patient deaf or have difficulty hearing?: No Does the patient have difficulty seeing, even when wearing glasses/contacts?: No Does the patient have difficulty concentrating, remembering, or making decisions?: No Patient able to express need for assistance with ADLs?: Yes Does the patient have difficulty dressing or bathing?: No Independently performs ADLs?: Yes (appropriate for developmental age) Does the patient have difficulty walking or climbing stairs?: No       Abuse/Neglect Assessment (Assessment to be complete while patient is alone) Physical Abuse: Denies Verbal Abuse: Denies Sexual Abuse: Denies Exploitation of patient/patient's resources: Denies Self-Neglect: Denies     Merchant navy officer (For Healthcare) Does patient have an advance directive?: No    Additional Information 1:1 In Past 12 Months?: No CIRT Risk: No Elopement Risk: No Does patient have medical clearance?: Yes     Disposition:  Disposition Initial Assessment Completed for this Encounter: Yes Disposition of Patient: Other dispositions (AM Psych eval ) Other disposition(s): Other (Comment) (AM Psych eval )  Brodie Correll S 07/09/2015 4:21 AM

## 2015-07-09 NOTE — ED Notes (Signed)
MD at bedside. 

## 2015-07-09 NOTE — ED Notes (Signed)
Requested patient to urinate. 
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
## 2015-08-21 ENCOUNTER — Emergency Department (HOSPITAL_COMMUNITY)
Admission: EM | Admit: 2015-08-21 | Discharge: 2015-08-21 | Disposition: A | Discharge status: Home / Self Care | Payer: BLUE CROSS/BLUE SHIELD | Attending: Physician Assistant | Admitting: Physician Assistant

## 2015-08-21 ENCOUNTER — Encounter (HOSPITAL_COMMUNITY): Payer: Self-pay | Admitting: Emergency Medicine

## 2015-08-21 DIAGNOSIS — J029 Acute pharyngitis, unspecified: Secondary | ICD-10-CM | POA: Diagnosis not present

## 2015-08-21 LAB — RAPID STREP SCREEN (MED CTR MEBANE ONLY): STREPTOCOCCUS, GROUP A SCREEN (DIRECT): NEGATIVE

## 2015-08-21 MED ORDER — PENICILLIN G BENZATHINE 1200000 UNIT/2ML IM SUSP
1.2000 10*6.[IU] | Freq: Once | INTRAMUSCULAR | Status: AC
  Administered 2015-08-21: 1.2 10*6.[IU] via INTRAMUSCULAR
  Filled 2015-08-21: qty 2

## 2015-08-21 NOTE — ED Notes (Signed)
PA at bedside.

## 2015-08-21 NOTE — ED Notes (Signed)
Sore throat and HA x 2 days. Taking sudafed without relief.

## 2015-08-21 NOTE — Discharge Instructions (Signed)
May use over the counter chloraseptic spray and/or salt water gargles to help with throat pain. Follow-up with your primary care physician. Return here for any new/worsening symptoms.

## 2015-08-21 NOTE — ED Provider Notes (Signed)
CSN: [id number]     Arrival date & time 08/21/15  1559 History   First MD Initiated Contact with Patient 08/21/15 1933     Chief Complaint  Patient presents with  . Sore Throat     (Consider location/radiation/quality/duration/timing/severity/associated sxs/prior Treatment) Patient is a 20 y.o. female presenting with pharyngitis. The history is provided by the patient and medical records.  Sore Throat Associated symptoms include a sore throat.   20 year old female with no significant past medical history presenting to the ED for sore throat. Patient states she's had a sore throat for approximately 3 days. Pain is worse with swallowing, but she's not had any difficulty eating or drinking. She had a mild headache last night, that has resolved at this time. She's been taking Sudafed without relief. Recent sick contacts, reports her friend has the flu. Denies any chest pain, shortness of breath, cough, fever, body aches, nausea, vomiting, or diarrhea. History of strep throat in the past several years ago.  No noted allergies.  VSS.  History reviewed. No pertinent past medical history. History reviewed. No pertinent past surgical history. Family History  Problem Relation Age of Onset  . CAD Mother    Social History  Substance Use Topics  . Smoking status: Never Smoker   . Smokeless tobacco: None  . Alcohol Use: No   OB History    No data available     Review of Systems  HENT: Positive for sore throat.   All other systems reviewed and are negative.     Allergies  Review of patient's allergies indicates no known allergies.  Home Medications   Prior to Admission medications   Not on File   BP 122/76 mmHg  Pulse 78  Temp(Src) 98.5 F (36.9 C) (Oral)  Resp 14  SpO2 96%   Physical Exam  Constitutional: She is oriented to person, place, and time. She appears well-developed and well-nourished. No distress.  HENT:  Head: Normocephalic and atraumatic.  Mouth/Throat:  Oropharynx is clear and moist.  Tonsils 1+ bilaterally with small exudates noted; uvula midline without evidence of peritonsillar abscess; handling secretions appropriately; no difficulty swallowing or speaking; normal phonation without stridor  Eyes: Conjunctivae and EOM are normal. Pupils are equal, round, and reactive to light.  Neck: Normal range of motion. Neck supple.  Cardiovascular: Normal rate, regular rhythm and normal heart sounds.   Pulmonary/Chest: Effort normal and breath sounds normal. No respiratory distress. She has no wheezes.  Abdominal: Soft. Bowel sounds are normal. There is no tenderness. There is no guarding.  Musculoskeletal: Normal range of motion. She exhibits no edema.  Lymphadenopathy:    She has cervical adenopathy.  Neurological: She is alert and oriented to person, place, and time.  Skin: Skin is warm and dry. She is not diaphoretic.  Psychiatric: She has a normal mood and affect.  Nursing note and vitals reviewed.   ED Course  Procedures (including critical care time) Labs Review Labs Reviewed  RAPID STREP SCREEN (NOT AT [facility])  CULTURE, GROUP A STREP [facility])    Imaging Review No results found. I have personally reviewed and evaluated these images and lab results as part of my medical decision-making.   EKG Interpretation None      MDM   Final diagnoses:  Sore throat   20 year old female here with sore throat. Patient afebrile, nontoxic. Tonsils 1+ bilaterally with small exudates noted. No evidence of peritonsillar abscess at this time. Handling secretions well, normal phonation without stridor.  Rapid strep is  negative, however patient clinically with signs/symptoms concerning for strep throat.  Will treat with bicillin here in ED.  D/c home with supportive care.  Discussed plan with patient, he/she acknowledged understanding and agreed with plan of care.  Return precautions given for new or worsening symptoms.  Garlon Hatchet, PA-C 08/21/15  1950  Courteney Randall An, MD 08/21/15 2350

## 2015-08-23 LAB — CULTURE, GROUP A STREP (THRC)

## 2015-09-07 ENCOUNTER — Encounter (HOSPITAL_COMMUNITY): Payer: Self-pay | Admitting: Emergency Medicine

## 2015-09-07 ENCOUNTER — Emergency Department (HOSPITAL_COMMUNITY)
Admission: EM | Admit: 2015-09-07 | Discharge: 2015-09-07 | Disposition: A | Discharge status: Home / Self Care | Payer: BLUE CROSS/BLUE SHIELD | Attending: Emergency Medicine | Admitting: Emergency Medicine

## 2015-09-07 DIAGNOSIS — J029 Acute pharyngitis, unspecified: Secondary | ICD-10-CM | POA: Diagnosis present

## 2015-09-07 LAB — RAPID STREP SCREEN (MED CTR MEBANE ONLY): STREPTOCOCCUS, GROUP A SCREEN (DIRECT): NEGATIVE

## 2015-09-07 MED ORDER — OXYMETAZOLINE HCL 0.05 % NA SOLN: 1.0000 | Freq: Two times a day (BID) | NASAL | Status: AC

## 2015-09-07 MED ORDER — IBUPROFEN 600 MG PO TABS: 600.0000 mg | ORAL_TABLET | Freq: Four times a day (QID) | ORAL | Status: AC | PRN

## 2015-09-07 NOTE — ED Notes (Signed)
Patient was alert, oriented and stable upon discharge. RN went over AVS and patient had no further questions.  

## 2015-09-07 NOTE — ED Provider Notes (Signed)
CSN: [id number]     Arrival date & time 09/07/15  2228 History  By signing my name below, I, Freida Busmaniana Omoyeni, attest that this documentation has been prepared under the direction and in the presence of non-physician practitioner, Dorthula Matasiffany G Avanell Banwart, PA-C. Electronically Signed: Freida Busmaniana Omoyeni, Scribe. 09/07/2015. 11:36 PM.  Chief Complaint  Patient presents with  . Sore Throat    The history is provided by the patient. No language interpreter was used.     HPI Comments:  Kiara Powell is a 20 y.o. female who presents to the Emergency Department complaining of sore throat x a few days. Pt was seen in the ED ~ 2 weeks ago for sore throat. She had negative strep but was given penicillin injection. Pt states symptom improved until ~ 1 week ago when she began to experience congestion, cough, and post nasal drip. Pt states her sore throat then returned. Pt states she originally attributed her symptoms to allergies until the sore throat returned. She has taken benadryl with little relief. She denies fever, chills, SOB, and CP.  History reviewed. No pertinent past medical history. History reviewed. No pertinent past surgical history. Family History  Problem Relation Age of Onset  . CAD Mother    Social History  Substance Use Topics  . Smoking status: Never Smoker   . Smokeless tobacco: None  . Alcohol Use: No   OB History    No data available     Review of Systems  Constitutional: Negative for fever and chills.  HENT: Positive for congestion, postnasal drip, rhinorrhea and sore throat.   Respiratory: Positive for cough. Negative for shortness of breath.   Cardiovascular: Negative for chest pain.    Allergies  Review of patient's allergies indicates no known allergies.  Home Medications   Prior to Admission medications   Not on File   BP 104/70 mmHg  Pulse 62  Temp(Src) 98.2 F (36.8 C) (Oral)  Resp 18  SpO2 100% Physical Exam  Constitutional: She is oriented to person, place, and  time. She appears well-developed and well-nourished. No distress.  HENT:  Head: Normocephalic and atraumatic.  Right Ear: Tympanic membrane, external ear and ear canal normal.  Left Ear: Tympanic membrane, external ear and ear canal normal.  Nose: Rhinorrhea present. Right sinus exhibits no maxillary sinus tenderness and no frontal sinus tenderness. Left sinus exhibits no maxillary sinus tenderness and no frontal sinus tenderness.  Mouth/Throat: Uvula is midline and mucous membranes are normal. No trismus in the jaw. Normal dentition. No dental abscesses or uvula swelling. No oropharyngeal exudate, posterior oropharyngeal edema, posterior oropharyngeal erythema or tonsillar abscesses.  No submental edema, tongue not elevated, no trismus. No impending airway obstruction; Pt able to speak full sentences, swallow intact, no drooling, stridor, or tonsillar/uvula displacement. No palatal petechia  Eyes: Conjunctivae are normal.  Neck: Trachea normal, normal range of motion and full passive range of motion without pain. Neck supple. No rigidity. Normal range of motion present. No Brudzinski's sign noted.  Flexion and extension of neck without pain or difficulty. Able to breath without difficulty in extension.  Cardiovascular: Normal rate and regular rhythm.   Pulmonary/Chest: Effort normal and breath sounds normal. No stridor. No respiratory distress. She has no wheezes.  Abdominal: Soft. She exhibits no distension. There is no tenderness.  No obvious evidence of splenomegaly. Non ttp.   Musculoskeletal: Normal range of motion.  Lymphadenopathy:       Head (right side): No preauricular and no posterior auricular adenopathy present.  Head (left side): No preauricular and no posterior auricular adenopathy present.    She has cervical adenopathy.  Neurological: She is alert and oriented to person, place, and time.  Skin: Skin is warm and dry. No rash noted. She is not diaphoretic.  Psychiatric: She  has a normal mood and affect.  Nursing note and vitals reviewed.   ED Course  Procedures   DIAGNOSTIC STUDIES:  Oxygen Saturation is 100% on RA, normal by my interpretation.    COORDINATION OF CARE:  11:37 PM Advised pt to rest and alternate between motrin and tylenol.  Discussed treatment plan with pt at bedside and pt agreed to plan.  Labs Review Labs Reviewed  RAPID STREP SCREEN (NOT AT [facility])  CULTURE, GROUP A STREP [facility])    Imaging Review No results found. I have personally reviewed and evaluated these images and lab results as part of my medical decision-making.   MDM   Final diagnoses:  Sore throat    Pt rapid strep test negative. Pt is tolerating secretions. Presentation not concerning for peritonsillar abscess or spread of infection to deep spaces of the throat; patent airway. Pt will be discharged with Afrin and Ibuprofen.  Specific return precautions discussed. Recommended PCP follow up.  Specific return precautions discussed.  Recommended PCP follow up, also given ENT referral. Pt appears safe for discharge.    I personally performed the services described in this documentation, which was scribed in my presence. The recorded information has been reviewed and is accurate.    Marlon Pel, PA-C 09/07/15 2352  April Palumbo, MD 09/08/15 [phone]

## 2015-09-07 NOTE — Discharge Instructions (Signed)
Sore Throat A sore throat is pain, burning, irritation, or scratchiness of the throat. There is often pain or tenderness when swallowing or talking. A sore throat may be accompanied by other symptoms, such as coughing, sneezing, fever, and swollen neck glands. A sore throat is often the first sign of another sickness, such as a cold, flu, strep throat, or mononucleosis (commonly known as mono). Most sore throats go away without medical treatment. CAUSES  The most common causes of a sore throat include:  A viral infection, such as a cold, flu, or mono.  A bacterial infection, such as strep throat, tonsillitis, or whooping cough.  Seasonal allergies.  Dryness in the air.  Irritants, such as smoke or pollution.  Gastroesophageal reflux disease (GERD). HOME CARE INSTRUCTIONS   Only take over-the-counter medicines as directed by your caregiver.  Drink enough fluids to keep your urine clear or pale yellow.  Rest as needed.  Try using throat sprays, lozenges, or sucking on hard candy to ease any pain (if older than 4 years or as directed).  Sip warm liquids, such as broth, herbal tea, or warm water with honey to relieve pain temporarily. You may also eat or drink cold or frozen liquids such as frozen ice pops.  Gargle with salt water (mix 1 tsp salt with 8 oz of water).  Do not smoke and avoid secondhand smoke.  Put a cool-mist humidifier in your bedroom at night to moisten the air. You can also turn on a hot shower and sit in the bathroom with the door closed for 5-10 minutes. SEEK IMMEDIATE MEDICAL CARE IF:  You have difficulty breathing.  You are unable to swallow fluids, soft foods, or your saliva.  You have increased swelling in the throat.  Your sore throat does not get better in 7 days.  You have nausea and vomiting.  You have a fever or persistent symptoms for more than 2-3 days.  You have a fever and your symptoms suddenly get worse. MAKE SURE YOU:   Understand  these instructions.  Will watch your condition.  Will get help right away if you are not doing well or get worse.   This information is not intended to replace advice given to you by your health care provider. Make sure you discuss any questions you have with your health care provider.   Document Released: 06/14/2004 Document Revised: 05/28/2014 Document Reviewed: 01/13/2012 Elsevier Interactive Patient Education 2016 [employer] Oxymetazoline nasal spray What is this medicine? Oxymetazoline (OX ee me TAZ oh leen) is a nasal decongestant. This medicine is used to treat nasal congestion or a stuffy nose. This medicine will not treat an infection. This medicine may be used for other purposes; ask your health care provider or pharmacist if you have questions. What should I tell my health care provider before I take this medicine? They need to know if you have any of these conditions: -diabetes -heart disease -high blood pressure -thyroid disease -trouble urinating due to an enlarged prostate gland -an unusual or allergic reaction to oxymetazoline, other medicines, foods, dyes, or preservatives -pregnant or trying to get pregnant -breast-feeding How should I use this medicine? This medicine is for use in the nose. Do not take by mouth. Follow the directions on the package label. Shake well before using. Use your medicine at regular intervals or as directed by your health care provider. Do not use it more often than directed. Do not use for more than 3 days in a row without advice.  Make sure that you are using your nasal spray correctly. Ask your doctor or health care provider if you have any questions. Talk to your pediatrician regarding the use of this medicine in children. While this drug may be prescribed for children for selected conditions, precautions do apply. Overdosage: If you think you have taken too much of this medicine contact a poison control center or emergency room at  once. NOTE: This medicine is only for you. Do not share this medicine with others. What if I miss a dose? If you miss a dose, use it as soon as you can. If it is almost time for your next dose, use only that dose. Do not use double or extra doses. What may interact with this medicine? Do not take this medicine with any of the following medications: -MAOIs like Marplan, Nardil, and Parnate This list may not describe all possible interactions. Give your health care provider a list of all the medicines, herbs, non-prescription drugs, or dietary supplements you use. Also tell them if you smoke, drink alcohol, or use illegal drugs. Some items may interact with your medicine. What should I watch for while using this medicine? Tell your doctor or healthcare professional if your symptoms do not start to get better or if they get worse. Do not share this bottle with anyone else as this may spread germs. What side effects may I notice from receiving this medicine? Side effects that you should report to your doctor or health care professional as soon as possible: -allergic reactions like skin rash, itching or hives, swelling of the face, lips, or tongue Side effects that usually do not require medical attention (Report these to your doctor or health care professional if they continue or are bothersome.): -burning, stinging, or irritation in the nose right after use -increased nasal discharge -sneezing This list may not describe all possible side effects. Call your doctor for medical advice about side effects. You may report side effects to FDA at 1-800-FDA-1088. Where should I keep my medicine? Keep out of the reach of children. Store at room temperature between 20 and 25 degrees C (68 and 77 degrees F). Throw away any unused medicine after the expiration date. NOTE: This sheet is a summary. It may not cover all possible information. If you have questions about this medicine, talk to your doctor,  pharmacist, or health care provider.    2016, Elsevier/[employer] Standard. (2010-12-06 14:11:31)

## 2015-09-07 NOTE — ED Notes (Signed)
Pt states that she has had a sore throat with swollen lymph nodes x several days. Strep throat 2.5 weeks ago. Alert and oriented.

## 2015-09-09 LAB — CULTURE, GROUP A STREP (THRC)

## 2016-12-03 ENCOUNTER — Emergency Department (HOSPITAL_COMMUNITY)
Admission: EM | Admit: 2016-12-03 | Discharge: 2016-12-03 | Disposition: A | Discharge status: Home / Self Care | Payer: 59 | Attending: Emergency Medicine | Admitting: Emergency Medicine

## 2016-12-03 ENCOUNTER — Encounter (HOSPITAL_COMMUNITY): Payer: Self-pay | Admitting: Emergency Medicine

## 2016-12-03 DIAGNOSIS — Z79899 Other long term (current) drug therapy: Secondary | ICD-10-CM | POA: Diagnosis not present

## 2016-12-03 DIAGNOSIS — R51 Headache: Secondary | ICD-10-CM | POA: Insufficient documentation

## 2016-12-03 DIAGNOSIS — R519 Headache, unspecified: Secondary | ICD-10-CM

## 2016-12-03 LAB — CBC
HEMATOCRIT: 33.7 % — AB (ref 36.0–46.0)
HEMOGLOBIN: 11.3 g/dL — AB (ref 12.0–15.0)
MCH: 27.4 pg (ref 26.0–34.0)
MCHC: 33.5 g/dL (ref 30.0–36.0)
MCV: 81.6 fL (ref 78.0–100.0)
Platelets: 225 10*3/uL (ref 150–400)
RBC: 4.13 MIL/uL (ref 3.87–5.11)
RDW: 12.9 % (ref 11.5–15.5)
WBC: 6.6 10*3/uL (ref 4.0–10.5)

## 2016-12-03 LAB — I-STAT CHEM 8, ED
BUN: 8 mg/dL (ref 6–20)
CALCIUM ION: 1.17 mmol/L (ref 1.15–1.40)
Chloride: 103 mmol/L (ref 101–111)
Creatinine, Ser: 0.7 mg/dL (ref 0.44–1.00)
Glucose, Bld: 100 mg/dL — ABNORMAL HIGH (ref 65–99)
HCT: 38 % (ref 36.0–46.0)
Hemoglobin: 12.9 g/dL (ref 12.0–15.0)
Potassium: 4 mmol/L (ref 3.5–5.1)
SODIUM: 138 mmol/L (ref 135–145)
TCO2: 23 mmol/L (ref 0–100)

## 2016-12-03 LAB — I-STAT BETA HCG BLOOD, ED (MC, WL, AP ONLY)

## 2016-12-03 MED ORDER — KETOROLAC TROMETHAMINE 30 MG/ML IJ SOLN
30.0000 mg | Freq: Once | INTRAMUSCULAR | Status: AC
  Administered 2016-12-03: 30 mg via INTRAVENOUS
  Filled 2016-12-03: qty 1

## 2016-12-03 MED ORDER — NAPROXEN 375 MG PO TABS: 375.0000 mg | ORAL_TABLET | Freq: Two times a day (BID) | ORAL | 0 refills | Status: AC

## 2016-12-03 MED ORDER — SODIUM CHLORIDE 0.9 % IV BOLUS (SEPSIS)
500.0000 mL | Freq: Once | INTRAVENOUS | Status: AC
  Administered 2016-12-03: 500 mL via INTRAVENOUS

## 2016-12-03 MED ORDER — PROCHLORPERAZINE EDISYLATE 5 MG/ML IJ SOLN
10.0000 mg | Freq: Once | INTRAMUSCULAR | Status: AC
  Administered 2016-12-03: 10 mg via INTRAVENOUS
  Filled 2016-12-03: qty 2

## 2016-12-03 NOTE — ED Provider Notes (Signed)
[facility]-EMERGENCY DEPT Provider Note   CSN: [id number] Arrival date & time: 12/03/16  4098     History   Chief Complaint Chief Complaint  Patient presents with  . Headache    HPI Kiara Powell is a 21 y.o. female.  HPI Pt complains of a headache.  It was mild the last couple of weeks.  Behind her eyes mostly.  The headache has been throbbing.  The light bother her eyes and she has some fullness in her eears.  No vomiting, diarrhea.  No fever.  No neck pai.  No numbness or weakness. History reviewed. No pertinent past medical history.  Patient Active Problem List   Diagnosis Date Noted  . Alcoholic intoxication without complication (HCC) 07/09/2015  . Alcohol intoxication (HCC)   . Anxiety     History reviewed. No pertinent surgical history.  OB History    No data available       Home Medications    Prior to Admission medications   Medication Sig Start Date End Date Taking? Authorizing Provider  ibuprofen (ADVIL,MOTRIN) 600 MG tablet Take 1 tablet (600 mg total) by mouth every 6 (six) hours as needed. 09/07/15   Marlon Pel, PA-C  naproxen (NAPROSYN) 375 MG tablet Take 1 tablet (375 mg total) by mouth 2 (two) times daily. 12/03/16   Linwood Dibbles, MD  oxymetazoline (AFRIN NASAL SPRAY) 0.05 % nasal spray Place 1 spray into both nostrils 2 (two) times daily. 09/07/15   Marlon Pel, PA-C    Family History Family History  Problem Relation Age of Onset  . CAD Mother     Social History Social History  Substance Use Topics  . Smoking status: Never Smoker  . Smokeless tobacco: Not on file  . Alcohol use No     Allergies   Patient has no known allergies.   Review of Systems Review of Systems  All other systems reviewed and are negative.    Physical Exam Updated Vital Signs BP 122/78 (BP Location: Right Arm)   Pulse 68   Temp 98 F (36.7 C)   Resp 18   LMP 11/26/2016 (Approximate)   SpO2 99%   Physical Exam  Constitutional: She appears  well-developed and well-nourished. No distress.  HENT:  Head: Normocephalic and atraumatic.  Right Ear: External ear normal.  Left Ear: External ear normal.  Eyes: Conjunctivae are normal. Right eye exhibits no discharge. Left eye exhibits no discharge. No scleral icterus.  Neck: Normal range of motion. Neck supple. No tracheal deviation present.  Cardiovascular: Normal rate, regular rhythm and intact distal pulses.   Pulmonary/Chest: Effort normal and breath sounds normal. No stridor. No respiratory distress. She has no wheezes. She has no rales.  Abdominal: Soft. Bowel sounds are normal. She exhibits no distension. There is no tenderness. There is no rebound and no guarding.  Musculoskeletal: She exhibits no edema or tenderness.  Neurological: She is alert. She has normal strength. She is not disoriented. No cranial nerve deficit (no facial droop, extraocular movements intact, no slurred speech) or sensory deficit. She exhibits normal muscle tone. She displays no seizure activity. Coordination normal.  Skin: Skin is warm and dry. No rash noted.  Psychiatric: She has a normal mood and affect.  Nursing note and vitals reviewed.    ED Treatments / Results  Labs (all labs ordered are listed, but only abnormal results are displayed) Labs Reviewed  CBC - Abnormal; Notable for the following:       Result Value   Hemoglobin  11.3 (*)    HCT 33.7 (*)    All other components within normal limits  I-STAT CHEM 8, ED - Abnormal; Notable for the following:    Glucose, Bld 100 (*)    All other components within normal limits  I-STAT BETA HCG BLOOD, ED ([facility], [facility], [facility] ONLY)     Procedures Procedures (including critical care time)  Medications Ordered in ED Medications  prochlorperazine (COMPAZINE) injection 10 mg (10 mg Intravenous Given 12/03/16 1254)  ketorolac (TORADOL) 30 MG/ML injection 30 mg (30 mg Intravenous Given 12/03/16 1257)  sodium chloride 0.9 % bolus 500 mL (500 mLs Intravenous New  Bag/Given 12/03/16 1253)     Initial Impression / Assessment and Plan / ED Course  I have reviewed the triage vital signs and the nursing notes.  Pertinent labs & imaging results that were available during my care of the patient were reviewed by me and considered in my medical decision making (see chart for details).    Patient's symptoms are suggestive of a migraine-type headache. No findings concerning for meningitis, subarachnoid hemorrhage or brain tumor. Patient's symptoms improved with treatment. We discussed outpatient follow-up if her symptoms do not resolve completely  Final Clinical Impressions(s) / ED Diagnoses   Final diagnoses:  Acute nonintractable headache, unspecified headache type    New Prescriptions New Prescriptions   NAPROXEN (NAPROSYN) 375 MG TABLET    Take 1 tablet (375 mg total) by mouth 2 (two) times daily.     Linwood DibblesKnapp, Anndee Connett, MD 12/03/16 [phone]

## 2016-12-03 NOTE — Discharge Instructions (Signed)
Follow-up with a primary care doctor, take the medications as needed for your headache

## 2016-12-03 NOTE — ED Triage Notes (Signed)
Pt states she has had a mild HA x 2 weeks that worsened last night. Not usually prone to migraines. Alert and oriented. Neuro intact.

## 2017-01-12 IMAGING — CR DG CHEST 2V
2 series · 2 of 2 positions shown · non-contrast
Comparison: None.

CLINICAL DATA: Centralized non radiating chest pain beginning this
afternoon while running. Shortness of breath.

EXAM:
CHEST  2 VIEW

[w chest pa]
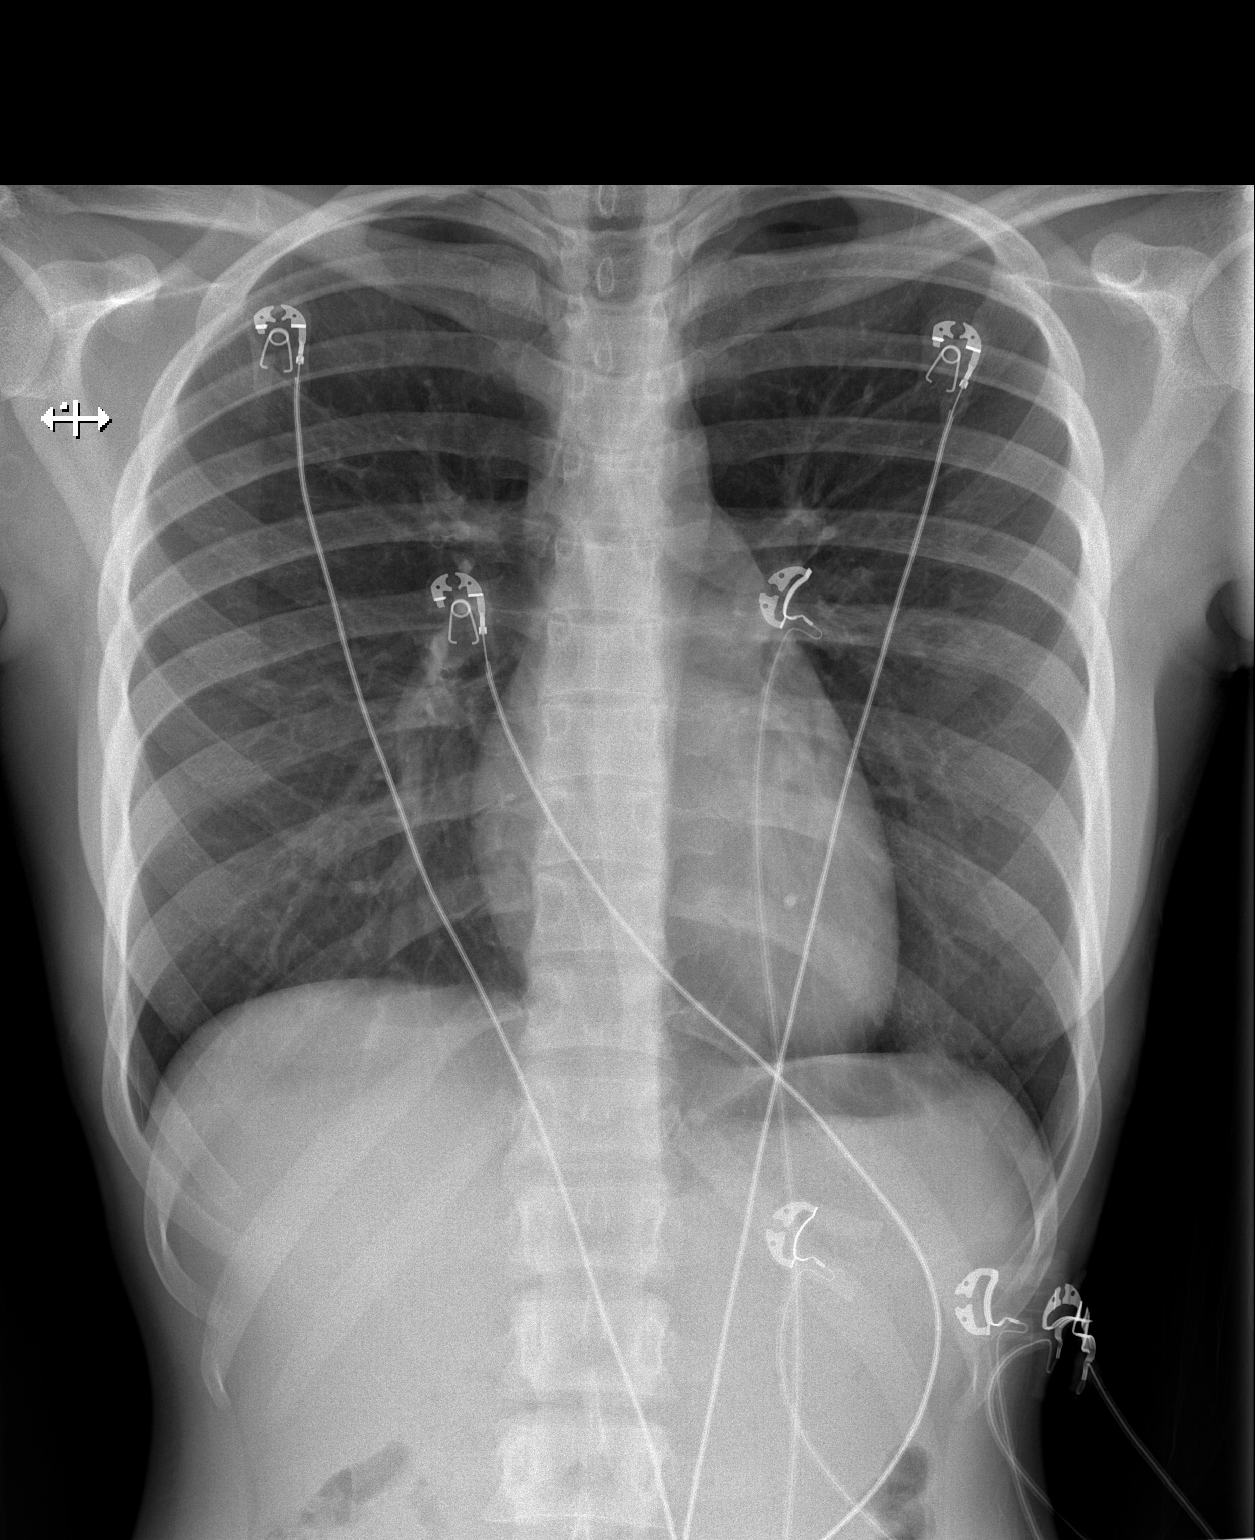

[w chest lat]
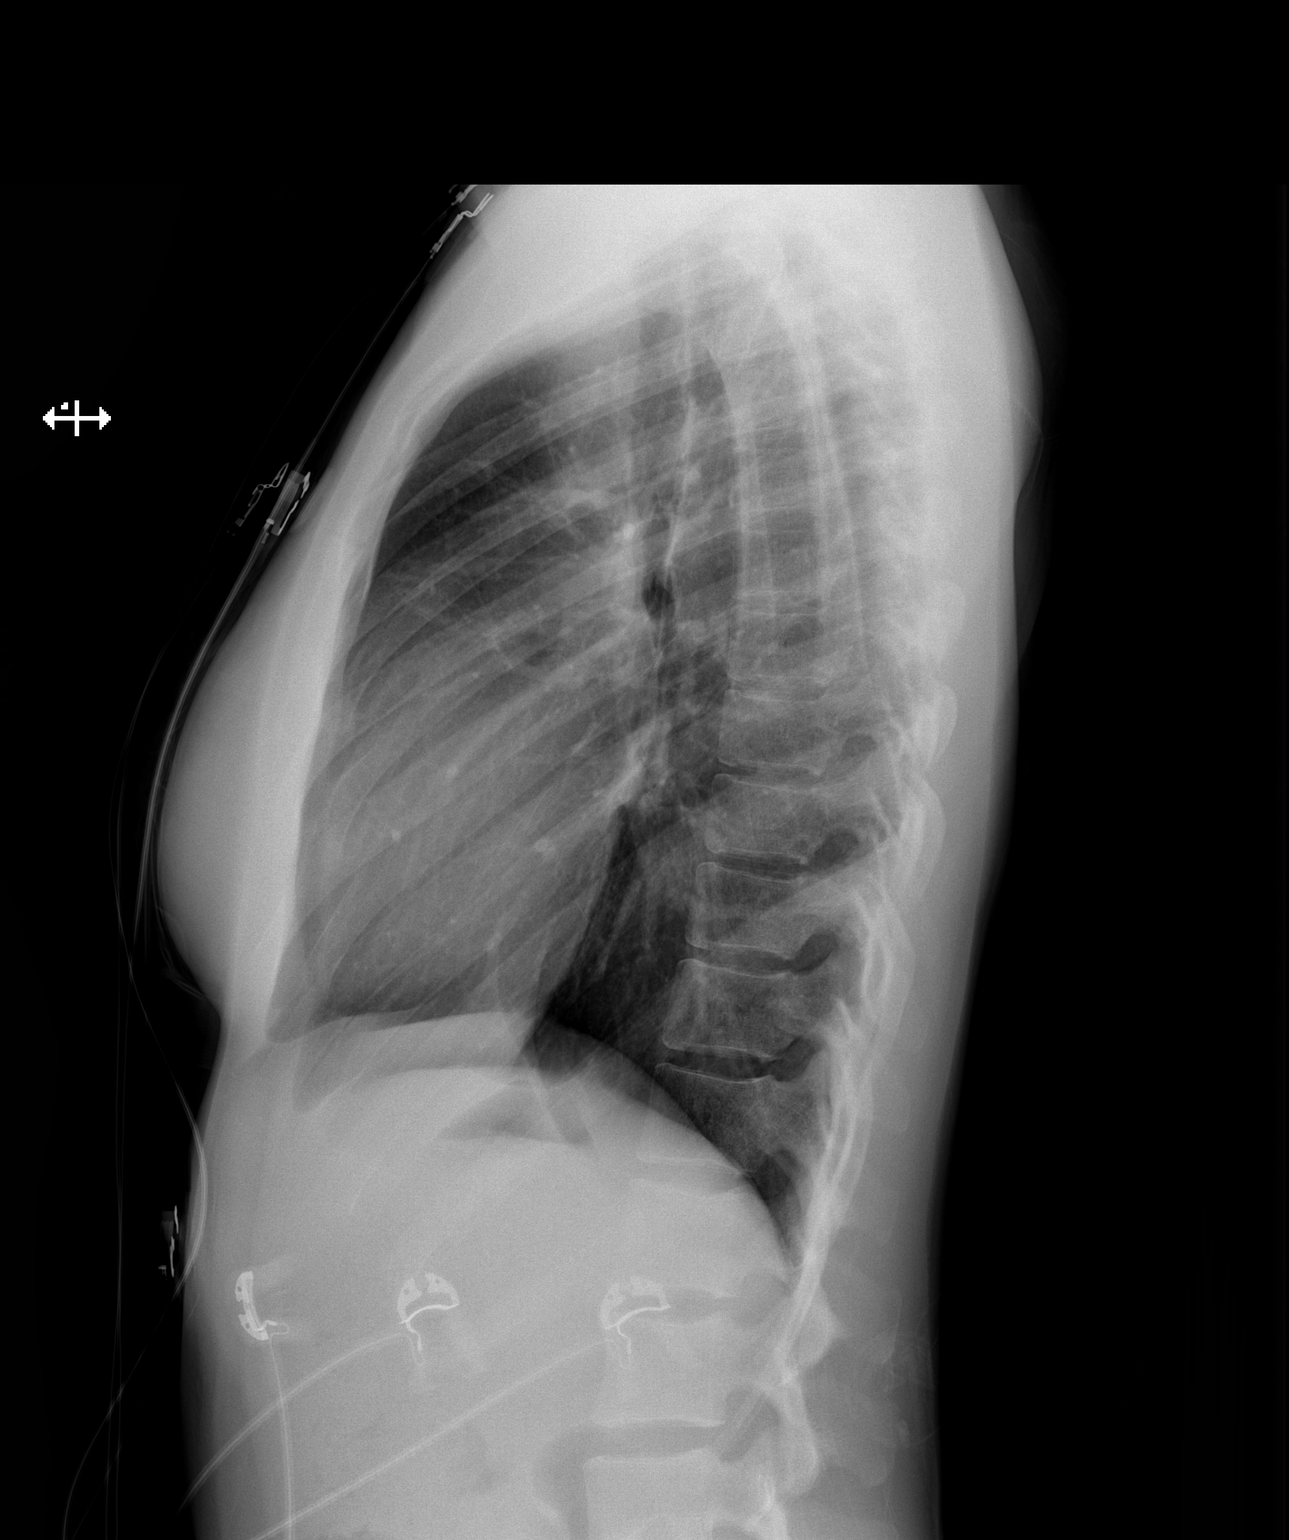

[2 of 2 positions shown; findings below may reference images not displayed]

FINDINGS: Cardiomediastinal silhouette is normal. The lungs are clear without
pleural effusions or focal consolidations. Trachea projects midline
and there is no pneumothorax. Soft tissue planes and included
osseous structures are non-suspicious.
IMPRESSION: Normal chest.

## 2019-05-01 ENCOUNTER — Other Ambulatory Visit: Payer: Self-pay

## 2019-05-01 DIAGNOSIS — Z20822 Contact with and (suspected) exposure to covid-19: Secondary | ICD-10-CM

## 2019-05-03 LAB — NOVEL CORONAVIRUS, NAA: SARS-CoV-2, NAA: NOT DETECTED
